# Patient Record
Sex: Male | Born: 1995 | Race: Black or African American | Hispanic: No | Marital: Single | Smoking: Never smoker
Health system: Southern US, Community
[De-identification: ages and names within clinical notes are randomized; demographics above are authoritative.]

## PROBLEM LIST (undated history)

## (undated) DIAGNOSIS — M6150 Other ossification of muscle, unspecified site: Secondary | ICD-10-CM

---
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
## 2022-10-29 ENCOUNTER — Ambulatory Visit (INDEPENDENT_AMBULATORY_CARE_PROVIDER_SITE_OTHER): Payer: BC Managed Care – PPO | Admitting: Family Medicine

## 2022-10-29 VITALS — BP 158/104 | HR 74 | Ht 68.5 in | Wt 178.0 lb

## 2022-10-29 DIAGNOSIS — M5442 Lumbago with sciatica, left side: Secondary | ICD-10-CM | POA: Diagnosis not present

## 2022-10-29 MED ORDER — MELOXICAM 15 MG PO TABS: ORAL_TABLET | ORAL | 3 refills | Status: AC

## 2022-10-29 MED ORDER — TIZANIDINE HCL 4 MG PO TABS: 4.0000 mg | ORAL_TABLET | Freq: Three times a day (TID) | ORAL | 1 refills | Status: AC | PRN

## 2022-10-29 NOTE — Progress Notes (Unsigned)
   Rubin Payor, PhD, LAT, ATC acting as a scribe for Clementeen Graham, MD.  Carlos Shaffer is a 27 y.o. male who presents to [facility] today for low back pain around June 14th. Pt locates pain to the L side of his low back, L buttock, w/ radiating to the posterior aspect of his thigh, stopping at mid-thigh.  Radiating pain: yes LE numbness/tingling: yes LE weakness: yes Aggravates: standing, prolonged sitting Treatments tried: chiro, prednisone, Tylenol, (possible Toradol injection)  Pertinent review of systems: No fevers or chills  Relevant historical information: Otherwise healthy   Exam:  BP (!) 158/104   Pulse 74   Ht 5' 8.5" (1.74 m)   Wt 178 lb (80.7 kg)   SpO2 96%   BMI 26.67 kg/m  General: Well Developed, well nourished, and in no acute distress.   MSK: L-spine: Normal appearing. Nontender palpation midline. Tender palpation left lumbar paraspinal musculature. Lower extremity strength is intact Reflexes are intact. Negative slump test with straight leg raise test.    Lab and Radiology Results  Patient had a normal lumbar spine x-ray at urgent care last week.  I do not have access to the images at this time.    Assessment and Plan: 27 y.o. male with acute left low back pain with pain radiating to referring down to the posterior thigh.  I think the pain is muscle spasm and dysfunction and perhaps spondylolisthesis or pars defects.  He does not have classic sciatica symptoms.  His pain does not radiate all the way down the leg and is not associated with weakness.  He did have a reportedly normal x-ray already.  Plan for meloxicam tizanidine physical therapy and if not improving MRI.  He lives in [place] so we will pick up physical therapy location in [place].   PDMP not reviewed this encounter. Orders Placed This Encounter  Procedures   Ambulatory referral to Physical Therapy    Referral Priority:   Routine    Referral Type:    Physical Medicine    Referral Reason:   Specialty Services Required    Requested Specialty:   Physical Therapy    Number of Visits Requested:   1   Meds ordered this encounter  Medications   meloxicam (MOBIC) 15 MG tablet    Sig: One tab PO qAM with breakfast for 2 weeks, then daily prn pain.    Dispense:  30 tablet    Refill:  3   tiZANidine (ZANAFLEX) 4 MG tablet    Sig: Take 1 tablet (4 mg total) by mouth every 8 (eight) hours as needed for muscle spasms.    Dispense:  30 tablet    Refill:  1     Discussed warning signs or symptoms. Please see discharge instructions. Patient expresses understanding.   The above documentation has been reviewed and is accurate and complete Clementeen Graham, M.D.

## 2022-10-29 NOTE — Patient Instructions (Addendum)
Thank you for coming in today.   I've referred you to Physical Therapy.  Let us know if you don't hear from them in one week.   Try the meloxicam and tizinadine as needed.   If this is not working next step will be an MRI.   Keep me updated.   I think this is likely a muscle spasm in your back.   I am worried about Spondylolysis.
